# Patient Record
Sex: Male | Born: 1955 | Race: White | Hispanic: No | ZIP: J0N
Health system: Southern US, Community
[De-identification: ages and names within clinical notes are randomized; demographics above are authoritative.]

## PROBLEM LIST (undated history)

## (undated) DIAGNOSIS — I10 Essential (primary) hypertension: Secondary | ICD-10-CM

## (undated) DIAGNOSIS — E785 Hyperlipidemia, unspecified: Secondary | ICD-10-CM

## (undated) DIAGNOSIS — H409 Unspecified glaucoma: Secondary | ICD-10-CM

---

## 2020-07-11 ENCOUNTER — Emergency Department: Payer: PRIVATE HEALTH INSURANCE

## 2020-07-11 ENCOUNTER — Emergency Department
Admission: EM | Admit: 2020-07-11 | Discharge: 2020-07-11 | Disposition: A | Discharge status: Home / Self Care | Payer: PRIVATE HEALTH INSURANCE | Attending: Emergency Medicine | Admitting: Emergency Medicine

## 2020-07-11 ENCOUNTER — Other Ambulatory Visit: Payer: Self-pay

## 2020-07-11 DIAGNOSIS — M545 Low back pain, unspecified: Secondary | ICD-10-CM | POA: Insufficient documentation

## 2020-07-11 DIAGNOSIS — I10 Essential (primary) hypertension: Secondary | ICD-10-CM | POA: Diagnosis not present

## 2020-07-11 DIAGNOSIS — Y92410 Unspecified street and highway as the place of occurrence of the external cause: Secondary | ICD-10-CM | POA: Diagnosis not present

## 2020-07-11 DIAGNOSIS — S3991XA Unspecified injury of abdomen, initial encounter: Secondary | ICD-10-CM | POA: Diagnosis present

## 2020-07-11 DIAGNOSIS — R0789 Other chest pain: Secondary | ICD-10-CM | POA: Diagnosis not present

## 2020-07-11 DIAGNOSIS — S30811A Abrasion of abdominal wall, initial encounter: Secondary | ICD-10-CM | POA: Diagnosis not present

## 2020-07-11 DIAGNOSIS — S50811A Abrasion of right forearm, initial encounter: Secondary | ICD-10-CM | POA: Insufficient documentation

## 2020-07-11 HISTORY — DX: Hyperlipidemia, unspecified: E78.5

## 2020-07-11 HISTORY — DX: Essential (primary) hypertension: I10

## 2020-07-11 HISTORY — DX: Unspecified glaucoma: H40.9

## 2020-07-11 MED ORDER — CYCLOBENZAPRINE HCL 10 MG PO TABS: 10.0000 mg | ORAL_TABLET | Freq: Three times a day (TID) | ORAL | 0 refills | Status: AC | PRN

## 2020-07-11 MED ORDER — KETOROLAC TROMETHAMINE 30 MG/ML IJ SOLN
30.0000 mg | Freq: Once | INTRAMUSCULAR | Status: AC
Start: 2020-07-11 — End: 2020-07-11
  Administered 2020-07-11: 30 mg via INTRAVENOUS
  Filled 2020-07-11: qty 1
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
## 2020-07-11 NOTE — ED Notes (Signed)
IV catheter removed intact without complication.  D/C instructions given.  RX and follow up discussed. Interpreter used.  All questions addressed.  Understanding verbalized.
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
